# Patient Record
Sex: Male | Born: 1950 | Race: White | Hispanic: No | Marital: Married
Health system: Southern US, Community
[De-identification: ages and names within clinical notes are randomized; demographics above are authoritative.]

---

## 2016-03-31 ENCOUNTER — Encounter (HOSPITAL_COMMUNITY): Payer: Self-pay | Admitting: Cardiology

## 2016-03-31 ENCOUNTER — Inpatient Hospital Stay (HOSPITAL_COMMUNITY): Payer: Medicare HMO

## 2016-03-31 ENCOUNTER — Emergency Department (HOSPITAL_COMMUNITY): Payer: Medicare HMO

## 2016-03-31 ENCOUNTER — Encounter (HOSPITAL_COMMUNITY): Admission: EM | Disposition: E | Payer: Self-pay | Source: Home / Self Care | Attending: Cardiology

## 2016-03-31 DIAGNOSIS — I255 Ischemic cardiomyopathy: Secondary | ICD-10-CM | POA: Diagnosis present

## 2016-03-31 DIAGNOSIS — G253 Myoclonus: Secondary | ICD-10-CM | POA: Diagnosis present

## 2016-03-31 DIAGNOSIS — I249 Acute ischemic heart disease, unspecified: Secondary | ICD-10-CM

## 2016-03-31 DIAGNOSIS — E872 Acidosis: Secondary | ICD-10-CM | POA: Diagnosis present

## 2016-03-31 DIAGNOSIS — I469 Cardiac arrest, cause unspecified: Secondary | ICD-10-CM | POA: Diagnosis present

## 2016-03-31 DIAGNOSIS — N179 Acute kidney failure, unspecified: Secondary | ICD-10-CM | POA: Diagnosis present

## 2016-03-31 DIAGNOSIS — J9601 Acute respiratory failure with hypoxia: Secondary | ICD-10-CM | POA: Diagnosis present

## 2016-03-31 DIAGNOSIS — Z7984 Long term (current) use of oral hypoglycemic drugs: Secondary | ICD-10-CM | POA: Diagnosis not present

## 2016-03-31 DIAGNOSIS — I462 Cardiac arrest due to underlying cardiac condition: Secondary | ICD-10-CM | POA: Diagnosis present

## 2016-03-31 DIAGNOSIS — I472 Ventricular tachycardia: Secondary | ICD-10-CM | POA: Diagnosis present

## 2016-03-31 DIAGNOSIS — E871 Hypo-osmolality and hyponatremia: Secondary | ICD-10-CM | POA: Diagnosis present

## 2016-03-31 DIAGNOSIS — I2510 Atherosclerotic heart disease of native coronary artery without angina pectoris: Secondary | ICD-10-CM | POA: Diagnosis not present

## 2016-03-31 DIAGNOSIS — Z87891 Personal history of nicotine dependence: Secondary | ICD-10-CM

## 2016-03-31 DIAGNOSIS — Z66 Do not resuscitate: Secondary | ICD-10-CM | POA: Diagnosis not present

## 2016-03-31 DIAGNOSIS — J00 Acute nasopharyngitis [common cold]: Secondary | ICD-10-CM | POA: Diagnosis present

## 2016-03-31 DIAGNOSIS — Z515 Encounter for palliative care: Secondary | ICD-10-CM | POA: Diagnosis not present

## 2016-03-31 DIAGNOSIS — R402212 Coma scale, best verbal response, none, at arrival to emergency department: Secondary | ICD-10-CM | POA: Diagnosis present

## 2016-03-31 DIAGNOSIS — E119 Type 2 diabetes mellitus without complications: Secondary | ICD-10-CM | POA: Diagnosis present

## 2016-03-31 DIAGNOSIS — I4901 Ventricular fibrillation: Secondary | ICD-10-CM | POA: Diagnosis present

## 2016-03-31 DIAGNOSIS — G931 Anoxic brain damage, not elsewhere classified: Secondary | ICD-10-CM | POA: Diagnosis present

## 2016-03-31 DIAGNOSIS — R402312 Coma scale, best motor response, none, at arrival to emergency department: Secondary | ICD-10-CM | POA: Diagnosis present

## 2016-03-31 DIAGNOSIS — I5043 Acute on chronic combined systolic (congestive) and diastolic (congestive) heart failure: Secondary | ICD-10-CM | POA: Diagnosis present

## 2016-03-31 DIAGNOSIS — I214 Non-ST elevation (NSTEMI) myocardial infarction: Secondary | ICD-10-CM | POA: Diagnosis present

## 2016-03-31 DIAGNOSIS — I2584 Coronary atherosclerosis due to calcified coronary lesion: Secondary | ICD-10-CM | POA: Diagnosis present

## 2016-03-31 DIAGNOSIS — Z978 Presence of other specified devices: Secondary | ICD-10-CM

## 2016-03-31 DIAGNOSIS — R402112 Coma scale, eyes open, never, at arrival to emergency department: Secondary | ICD-10-CM | POA: Diagnosis present

## 2016-03-31 HISTORY — PX: PERIPHERAL VASCULAR CATHETERIZATION: SHX172C

## 2016-03-31 HISTORY — PX: CARDIAC CATHETERIZATION: SHX172

## 2016-03-31 LAB — POCT I-STAT, CHEM 8
BUN: 24 mg/dL — ABNORMAL HIGH (ref 6–20)
CALCIUM ION: 1.14 mmol/L — AB (ref 1.15–1.40)
CREATININE: 1.1 mg/dL (ref 0.61–1.24)
Chloride: 102 mmol/L (ref 101–111)
GLUCOSE: 435 mg/dL — AB (ref 65–99)
HEMATOCRIT: 39 % (ref 39.0–52.0)
HEMOGLOBIN: 13.3 g/dL (ref 13.0–17.0)
Potassium: 3.8 mmol/L (ref 3.5–5.1)
Sodium: 138 mmol/L (ref 135–145)
TCO2: 20 mmol/L (ref 0–100)

## 2016-03-31 LAB — GLUCOSE, CAPILLARY
GLUCOSE-CAPILLARY: 391 mg/dL — AB (ref 65–99)
Glucose-Capillary: 377 mg/dL — ABNORMAL HIGH (ref 65–99)
Glucose-Capillary: 424 mg/dL — ABNORMAL HIGH (ref 65–99)
Glucose-Capillary: 289 mg/dL — ABNORMAL HIGH (ref 65–99)

## 2016-03-31 LAB — COMPREHENSIVE METABOLIC PANEL
ALT: 46 U/L (ref 17–63)
ANION GAP: 14 (ref 5–15)
AST: 74 U/L — ABNORMAL HIGH (ref 15–41)
Albumin: 2.7 g/dL — ABNORMAL LOW (ref 3.5–5.0)
Alkaline Phosphatase: 75 U/L (ref 38–126)
BILIRUBIN TOTAL: 0.7 mg/dL (ref 0.3–1.2)
BUN: 22 mg/dL — ABNORMAL HIGH (ref 6–20)
CO2: 18 mmol/L — ABNORMAL LOW (ref 22–32)
Calcium: 7.7 mg/dL — ABNORMAL LOW (ref 8.9–10.3)
Chloride: 101 mmol/L (ref 101–111)
Creatinine, Ser: 1.3 mg/dL — ABNORMAL HIGH (ref 0.61–1.24)
GFR, EST NON AFRICAN AMERICAN: 56 mL/min — AB (ref >60)
Glucose, Bld: 431 mg/dL — ABNORMAL HIGH (ref 65–99)
POTASSIUM: 3.8 mmol/L (ref 3.5–5.1)
Sodium: 133 mmol/L — ABNORMAL LOW (ref 135–145)
TOTAL PROTEIN: 5.4 g/dL — AB (ref 6.5–8.1)

## 2016-03-31 LAB — CBC
HCT: 43.8 % (ref 39.0–52.0)
HCT: 38.1 % — ABNORMAL LOW (ref 39.0–52.0)
HCT: 39 % (ref 39.0–52.0)
Hemoglobin: 14.2 g/dL (ref 13.0–17.0)
Hemoglobin: 12.4 g/dL — ABNORMAL LOW (ref 13.0–17.0)
Hemoglobin: 13 g/dL (ref 13.0–17.0)
MCH: 31 pg (ref 26.0–34.0)
MCH: 30.8 pg (ref 26.0–34.0)
MCH: 30.8 pg (ref 26.0–34.0)
MCHC: 32.4 g/dL (ref 30.0–36.0)
MCHC: 32.5 g/dL (ref 30.0–36.0)
MCHC: 33.3 g/dL (ref 30.0–36.0)
MCV: 95.6 fL (ref 78.0–100.0)
MCV: 92.4 fL (ref 78.0–100.0)
MCV: 94.8 fL (ref 78.0–100.0)
PLATELETS: 310 10*3/uL (ref 150–400)
PLATELETS: 262 10*3/uL (ref 150–400)
PLATELETS: 326 10*3/uL (ref 150–400)
RBC: 4.58 MIL/uL (ref 4.22–5.81)
RBC: 4.02 MIL/uL — ABNORMAL LOW (ref 4.22–5.81)
RBC: 4.22 MIL/uL (ref 4.22–5.81)
RDW: 12.8 % (ref 11.5–15.5)
RDW: 12.5 % (ref 11.5–15.5)
RDW: 12.7 % (ref 11.5–15.5)
WBC: 15.2 10*3/uL — AB (ref 4.0–10.5)
WBC: 24.7 10*3/uL — AB (ref 4.0–10.5)
WBC: 27.3 10*3/uL — AB (ref 4.0–10.5)

## 2016-03-31 LAB — BLOOD GAS, ARTERIAL
Acid-base deficit: 7.9 mmol/L — ABNORMAL HIGH (ref 0.0–2.0)
Bicarbonate: 17.4 mmol/L — ABNORMAL LOW (ref 20.0–28.0)
DRAWN BY: 246101
FIO2: 100
MECHVT: 500 mL
O2 SAT: 98.9 %
PEEP: 10 cmH2O
PH ART: 7.304 — AB (ref 7.350–7.450)
Patient temperature: 97.6
RATE: 18 resp/min
pCO2 arterial: 35.8 mmHg (ref 32.0–48.0)
pO2, Arterial: 203 mmHg — ABNORMAL HIGH (ref 83.0–108.0)

## 2016-03-31 LAB — LIPID PANEL
CHOL/HDL RATIO: 4.4 ratio
CHOLESTEROL: 127 mg/dL (ref 0–200)
HDL: 29 mg/dL — AB (ref >40)
LDL Cholesterol: 85 mg/dL (ref 0–99)
Triglycerides: 64 mg/dL (ref <150)
VLDL: 13 mg/dL (ref 0–40)

## 2016-03-31 LAB — TSH
TSH: 1.627 u[IU]/mL (ref 0.350–4.500)
TSH: 0.669 u[IU]/mL (ref 0.350–4.500)

## 2016-03-31 LAB — MRSA PCR SCREENING: MRSA BY PCR: NEGATIVE

## 2016-03-31 LAB — POCT I-STAT TROPONIN I: TROPONIN I, POC: 0.44 ng/mL — AB (ref 0.00–0.08)

## 2016-03-31 LAB — LACTIC ACID, PLASMA: LACTIC ACID, VENOUS: 5.4 mmol/L — AB (ref 0.5–1.9)

## 2016-03-31 LAB — PROTIME-INR
INR: 1.16
PROTHROMBIN TIME: 14.8 s (ref 11.4–15.2)

## 2016-03-31 LAB — CREATININE, SERUM
CREATININE: 1.31 mg/dL — AB (ref 0.61–1.24)
GFR calc non Af Amer: 55 mL/min — ABNORMAL LOW (ref >60)

## 2016-03-31 LAB — APTT: aPTT: 28 seconds (ref 24–36)

## 2016-03-31 LAB — PHOSPHORUS: PHOSPHORUS: 4 mg/dL (ref 2.5–4.6)

## 2016-03-31 LAB — MAGNESIUM: MAGNESIUM: 1.9 mg/dL (ref 1.7–2.4)

## 2016-03-31 LAB — PROCALCITONIN: Procalcitonin: 0.5 ng/mL

## 2016-03-31 LAB — TROPONIN I
TROPONIN I: 0.42 ng/mL — AB (ref <0.03)
TROPONIN I: 0.74 ng/mL — AB (ref <0.03)
Troponin I: 1.09 ng/mL (ref <0.03)

## 2016-03-31 LAB — TRIGLYCERIDES: Triglycerides: 71 mg/dL (ref <150)

## 2016-03-31 SURGERY — LEFT HEART CATH AND CORONARY ANGIOGRAPHY

## 2016-03-31 MED ORDER — SODIUM CHLORIDE 0.9% FLUSH: 3.0000 mL | INTRAVENOUS | Status: DC | PRN

## 2016-03-31 MED ORDER — METOPROLOL TARTRATE 5 MG/5ML IV SOLN
INTRAVENOUS | Status: AC
  Filled 2016-03-31: qty 5

## 2016-03-31 MED ORDER — FENTANYL CITRATE (PF) 100 MCG/2ML IJ SOLN
50.0000 ug | INTRAMUSCULAR | Status: AC | PRN
  Administered 2016-03-31 (×3): 50 ug via INTRAVENOUS

## 2016-03-31 MED ORDER — ADENOSINE 6 MG/2ML IV SOLN
INTRAVENOUS | Status: AC
  Filled 2016-03-31: qty 2

## 2016-03-31 MED ORDER — ACETAMINOPHEN 325 MG PO TABS: 650.0000 mg | ORAL_TABLET | ORAL | Status: DC | PRN

## 2016-03-31 MED ORDER — ADENOSINE 6 MG/2ML IV SOLN
INTRAVENOUS | Status: DC | PRN
  Administered 2016-03-31: 12 mg via INTRAVENOUS
  Administered 2016-03-31: 6 mg via INTRAVENOUS

## 2016-03-31 MED ORDER — CALCIUM GLUCONATE 10 % IV SOLN
1.0000 g | Freq: Once | INTRAVENOUS | Status: AC
  Administered 2016-03-31: 1 g via INTRAVENOUS
  Filled 2016-03-31: qty 10

## 2016-03-31 MED ORDER — FENTANYL CITRATE (PF) 100 MCG/2ML IJ SOLN
50.0000 ug | INTRAMUSCULAR | Status: DC | PRN
  Administered 2016-03-31: 50 ug via INTRAVENOUS
  Filled 2016-03-31: qty 2

## 2016-03-31 MED ORDER — SODIUM CHLORIDE 0.9 % IV SOLN: 250.0000 mL | INTRAVENOUS | Status: DC | PRN

## 2016-03-31 MED ORDER — ADENOSINE 6 MG/2ML IV SOLN
INTRAVENOUS | Status: AC
  Filled 2016-03-31: qty 4

## 2016-03-31 MED ORDER — CARVEDILOL 3.125 MG PO TABS
3.1250 mg | ORAL_TABLET | Freq: Two times a day (BID) | ORAL | Status: DC
  Administered 2016-03-31 (×2): 3.125 mg via ORAL
  Filled 2016-03-31 (×3): qty 1

## 2016-03-31 MED ORDER — ROCURONIUM BROMIDE 50 MG/5ML IV SOLN
INTRAVENOUS | Status: DC | PRN
  Administered 2016-03-31: 100 mg via INTRAVENOUS

## 2016-03-31 MED ORDER — LIDOCAINE HCL (PF) 1 % IJ SOLN
INTRAMUSCULAR | Status: AC
  Filled 2016-03-31: qty 30

## 2016-03-31 MED ORDER — MIDAZOLAM HCL 2 MG/2ML IJ SOLN
INTRAMUSCULAR | Status: AC
  Filled 2016-03-31: qty 2

## 2016-03-31 MED ORDER — INSULIN ASPART 100 UNIT/ML ~~LOC~~ SOLN
0.0000 [IU] | SUBCUTANEOUS | Status: DC
  Administered 2016-03-31: 8 [IU] via SUBCUTANEOUS
  Administered 2016-03-31: 15 [IU] via SUBCUTANEOUS
  Administered 2016-04-01: 3 [IU] via SUBCUTANEOUS
  Administered 2016-04-01: 5 [IU] via SUBCUTANEOUS

## 2016-03-31 MED ORDER — INSULIN REGULAR HUMAN 100 UNIT/ML IJ SOLN
8.0000 [IU] | Freq: Once | INTRAMUSCULAR | Status: DC
  Filled 2016-03-31: qty 0.08

## 2016-03-31 MED ORDER — PANTOPRAZOLE SODIUM 40 MG IV SOLR
40.0000 mg | Freq: Every day | INTRAVENOUS | Status: DC
  Administered 2016-03-31 – 2016-04-01 (×2): 40 mg via INTRAVENOUS
  Filled 2016-03-31 (×2): qty 40

## 2016-03-31 MED ORDER — IOPAMIDOL (ISOVUE-370) INJECTION 76%
INTRAVENOUS | Status: AC
  Filled 2016-03-31: qty 125

## 2016-03-31 MED ORDER — LORAZEPAM 2 MG/ML IJ SOLN
4.0000 mg | Freq: Once | INTRAMUSCULAR | Status: AC
  Administered 2016-03-31: 4 mg via INTRAVENOUS
  Filled 2016-03-31: qty 2

## 2016-03-31 MED ORDER — SODIUM CHLORIDE 0.9 % IV SOLN: INTRAVENOUS | Status: AC

## 2016-03-31 MED ORDER — ASPIRIN 81 MG PO CHEW: 324.0000 mg | CHEWABLE_TABLET | ORAL | Status: AC

## 2016-03-31 MED ORDER — MIDAZOLAM HCL 2 MG/2ML IJ SOLN
1.0000 mg | INTRAMUSCULAR | Status: DC | PRN
  Administered 2016-03-31: 1 mg via INTRAVENOUS
  Filled 2016-03-31: qty 2

## 2016-03-31 MED ORDER — SODIUM CHLORIDE 0.9 % IV SOLN
INTRAVENOUS | Status: DC | PRN
  Administered 2016-03-31: 1000 mL via INTRAVENOUS

## 2016-03-31 MED ORDER — ASPIRIN EC 81 MG PO TBEC
81.0000 mg | DELAYED_RELEASE_TABLET | Freq: Every day | ORAL | Status: DC
  Administered 2016-04-01: 81 mg via ORAL
  Filled 2016-03-31: qty 1

## 2016-03-31 MED ORDER — INSULIN ASPART 100 UNIT/ML ~~LOC~~ SOLN: 0.0000 [IU] | SUBCUTANEOUS | Status: DC

## 2016-03-31 MED ORDER — POTASSIUM CHLORIDE 20 MEQ/15ML (10%) PO SOLN
10.0000 meq | Freq: Two times a day (BID) | ORAL | Status: DC
  Administered 2016-03-31 – 2016-04-01 (×2): 10 meq via ORAL
  Filled 2016-03-31 (×2): qty 15

## 2016-03-31 MED ORDER — FUROSEMIDE 10 MG/ML IJ SOLN
20.0000 mg | Freq: Two times a day (BID) | INTRAMUSCULAR | Status: DC
Start: 2016-03-31 — End: 2016-04-01
  Administered 2016-03-31 – 2016-04-01 (×2): 20 mg via INTRAVENOUS
  Filled 2016-03-31 (×2): qty 2

## 2016-03-31 MED ORDER — FENTANYL CITRATE (PF) 100 MCG/2ML IJ SOLN
INTRAMUSCULAR | Status: DC | PRN
  Administered 2016-03-31: 100 ug via INTRAVENOUS

## 2016-03-31 MED ORDER — LIDOCAINE HCL (PF) 1 % IJ SOLN
INTRAMUSCULAR | Status: DC | PRN
  Administered 2016-03-31: 30 mL

## 2016-03-31 MED ORDER — HEPARIN SODIUM (PORCINE) 5000 UNIT/ML IJ SOLN
5000.0000 [IU] | Freq: Three times a day (TID) | INTRAMUSCULAR | Status: DC
  Administered 2016-03-31 – 2016-04-01 (×3): 5000 [IU] via SUBCUTANEOUS
  Filled 2016-03-31 (×3): qty 1

## 2016-03-31 MED ORDER — ORAL CARE MOUTH RINSE
15.0000 mL | Freq: Four times a day (QID) | OROMUCOSAL | Status: DC
  Administered 2016-04-01 – 2016-04-02 (×5): 15 mL via OROMUCOSAL

## 2016-03-31 MED ORDER — SODIUM CHLORIDE 0.9% FLUSH
3.0000 mL | Freq: Two times a day (BID) | INTRAVENOUS | Status: DC
  Administered 2016-03-31 – 2016-04-01 (×2): 3 mL via INTRAVENOUS

## 2016-03-31 MED ORDER — CHLORHEXIDINE GLUCONATE 0.12% ORAL RINSE (MEDLINE KIT)
15.0000 mL | Freq: Two times a day (BID) | OROMUCOSAL | Status: DC
  Administered 2016-03-31 – 2016-04-01 (×2): 15 mL via OROMUCOSAL

## 2016-03-31 MED ORDER — PROPOFOL 1000 MG/100ML IV EMUL
0.0000 ug/kg/min | INTRAVENOUS | Status: DC
  Administered 2016-03-31: 5 ug/kg/min via INTRAVENOUS
  Administered 2016-04-01: 20 ug/kg/min via INTRAVENOUS
  Filled 2016-03-31 (×2): qty 100

## 2016-03-31 MED ORDER — INSULIN ASPART 100 UNIT/ML ~~LOC~~ SOLN
8.0000 [IU] | Freq: Once | SUBCUTANEOUS | Status: AC
  Administered 2016-03-31: 8 [IU] via SUBCUTANEOUS

## 2016-03-31 MED ORDER — FENTANYL CITRATE (PF) 100 MCG/2ML IJ SOLN
INTRAMUSCULAR | Status: AC
  Filled 2016-03-31: qty 2

## 2016-03-31 MED ORDER — ONDANSETRON HCL 4 MG/2ML IJ SOLN
4.0000 mg | Freq: Four times a day (QID) | INTRAMUSCULAR | Status: DC | PRN
Start: 2016-03-31 — End: 2016-04-02

## 2016-03-31 MED ORDER — METOPROLOL TARTRATE 5 MG/5ML IV SOLN
5.0000 mg | Freq: Once | INTRAVENOUS | Status: AC
  Administered 2016-03-31: 5 mg via INTRAVENOUS

## 2016-03-31 MED ORDER — ASPIRIN 300 MG RE SUPP
300.0000 mg | RECTAL | Status: AC
  Administered 2016-03-31: 300 mg via RECTAL
  Filled 2016-03-31: qty 1

## 2016-03-31 MED ORDER — HEPARIN (PORCINE) IN NACL 2-0.9 UNIT/ML-% IJ SOLN
INTRAMUSCULAR | Status: DC | PRN
  Administered 2016-03-31: 1000 mL

## 2016-03-31 MED ORDER — IOPAMIDOL (ISOVUE-370) INJECTION 76%
INTRAVENOUS | Status: DC | PRN
  Administered 2016-03-31: 50 mL via INTRA_ARTERIAL

## 2016-03-31 MED ORDER — ETOMIDATE 2 MG/ML IV SOLN
INTRAVENOUS | Status: DC | PRN
  Administered 2016-03-31: 20 mg via INTRAVENOUS

## 2016-03-31 MED ORDER — ATORVASTATIN CALCIUM 80 MG PO TABS
80.0000 mg | ORAL_TABLET | Freq: Every day | ORAL | Status: DC
  Administered 2016-03-31: 80 mg via ORAL
  Filled 2016-03-31: qty 1

## 2016-03-31 MED ORDER — MIDAZOLAM HCL 2 MG/2ML IJ SOLN
1.0000 mg | INTRAMUSCULAR | Status: AC | PRN
  Administered 2016-03-31 (×3): 1 mg via INTRAVENOUS

## 2016-03-31 MED ORDER — POTASSIUM CHLORIDE ER 10 MEQ PO TBCR
10.0000 meq | EXTENDED_RELEASE_TABLET | Freq: Two times a day (BID) | ORAL | Status: DC
  Filled 2016-03-31: qty 1

## 2016-03-31 MED ORDER — NITROGLYCERIN 0.4 MG SL SUBL: 0.4000 mg | SUBLINGUAL_TABLET | SUBLINGUAL | Status: DC | PRN

## 2016-03-31 MED ORDER — HEPARIN (PORCINE) IN NACL 2-0.9 UNIT/ML-% IJ SOLN
INTRAMUSCULAR | Status: AC
  Filled 2016-03-31: qty 1000

## 2016-03-31 SURGICAL SUPPLY — 9 items
CATH INFINITI 5FR MPB2 (CATHETERS) ×3 IMPLANT
KIT ENCORE 26 ADVANTAGE (KITS) ×3 IMPLANT
KIT HEART LEFT (KITS) ×3 IMPLANT
PACK CARDIAC CATHETERIZATION (CUSTOM PROCEDURE TRAY) ×3 IMPLANT
SET INTRODUCER MICROPUNCT 5F (INTRODUCER) ×3 IMPLANT
SHEATH PINNACLE 6F 10CM (SHEATH) ×3 IMPLANT
TRANSDUCER W/STOPCOCK (MISCELLANEOUS) ×3 IMPLANT
TUBING CIL FLEX 10 FLL-RA (TUBING) ×3 IMPLANT
WIRE EMERALD 3MM-J .035X150CM (WIRE) ×3 IMPLANT

## 2016-03-31 NOTE — Procedures (Signed)
Intubation Procedure Note Lucas Allen [id number] 08/30/1950  Procedure: Intubation Indications: Airway protection and maintenance  Procedure Details Consent: Risks of procedure as well as the alternatives and risks of each were explained to the (patient/caregiver).  Consent for procedure obtained. and Unable to obtain consent because of emergent medical necessity. Time Out: Verified patient identification, verified procedure, site/side was marked, verified correct patient position, special equipment/implants available, medications/allergies/relevent history reviewed, required imaging and test results available.  Performed  Maximum sterile technique was used including gown and hand hygiene.  [place] and 4    Evaluation Hemodynamic Status: BP stable throughout; O2 sats: currently acceptable Patient's Current Condition: stable Complications: No apparent complications Patient did tolerate procedure well. Chest X-ray ordered to verify placement.  CXR: tube position acceptable.   Lucas Allen, Lucas Allen 04/07/2016

## 2016-03-31 NOTE — Progress Notes (Signed)
Responded to CPR in progress  page to support patient and family. Paient reported to ED via EMS after wife called 911.  Patient had been complaining of not feeling well and  SOB. Patient is current unconscienous, intubated and in cath lab.  Patient is waiting on room assignment. Family has been brief by both EDP and cath Lab staff/cardiologist. I provided ministry of presence, empathetic listening, emotional and spiritual  support to family. Also facilitated information sharing between family and staff. Escorted family to cath lab holding to visit with patient. Chaplain available as needed.   11-06-16 1217  Clinical Encounter Type  Visited With Family;Patient and family together;Health care provider  Visit Type Initial;Spiritual support;ED  Referral From Nurse  Spiritual Encounters  Spiritual Needs Prayer;Emotional  Stress Factors  Family Stress Factors Exhausted;Health changes  Venida JarvisWatlington, Tya Haughey, VirginiaChaplain,pager [phone]

## 2016-03-31 NOTE — Progress Notes (Signed)
EEG completed, results pending. 

## 2016-03-31 NOTE — Progress Notes (Signed)
Transported from ED to Cath Lab without complications.

## 2016-03-31 NOTE — Procedures (Signed)
History: [age] rolled male status post cardiac arrest  Sedation: Occasional fentanyl and Versed  Technique: This is a 21 channel routine scalp EEG performed at the bedside with bipolar and monopolar montages arranged in accordance to the international 10/20 system of electrode placement. One channel was dedicated to EKG recording.    Background: The background is completely flat with the exception of epileptiform-appearing bursts lasting about a second and occurring every 10-20 seconds. These are associated with clinical generalized myoclonus on video.  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 In this setting, this electrographic-clinical pattern is associated with a grim prognosis.  Ritta SlotMcNeill Greenleigh Kauth, MD [facility] [phone]  If 7pm- 7am, please page neurology on call as listed in AMION.

## 2016-03-31 NOTE — H&P (Signed)
Lucas Allen is an 66 y.o. male.   Chief Complaint: Cardiac arrest HPI: Patient with diabetes mellitus, who had been having cough and sputum production over the past 1 week, has been taking Mucinex D frequently at home and also over-the-counter allergy medicine. This morning he stated he didn't feel well. Then he passed out in light of his wife who called the EMS. Fire department very close to arrival on the scene in less than 5 minutes, AED was placed and he had a shockable rhythm and 5 shocks were delivered with return to spontaneous breathing and circulation.  Patient remained unresponsive to verbal commands, due to shallow breathing, he was intubated at home. In the interim EMS arrived. He had normal blood pressure and he was rushed to the emergency room. He also received 1 amp of epi reasons not known by the EMS. On arrival to the ED, patient unresponsive and tachycardic.    Surgical history right forearm amputation  Social History:  has no tobacco, alcohol, and drug history on file. quit cigarettes about 20 years ago, not a heavy smoker.   Allergies: No Known Allergies  Medications Prior to Admission  Medication Sig Dispense Refill  . metFORMIN (GLUCOPHAGE) 500 MG tablet Take 500 mg by mouth 2 (two) times daily with a meal.    . pseudoephedrine-guaifenesin (MUCINEX D) 60-600 MG 12 hr tablet Take 1 tablet by mouth every 12 (twelve) hours.      Results for orders placed or performed during the hospital encounter of 04/26/2016 (from the past 48 hour(s))  CBC     Status: Abnormal   Collection Time: 03/30/2016 10:33 AM  Result Value Ref Range   WBC 15.2 (H) 4.0 - 10.5 K/uL   RBC 4.58 4.22 - 5.81 MIL/uL   Hemoglobin 14.2 13.0 - 17.0 g/dL   HCT 16.143.8 09.639.0 - 04.552.0 %   MCV 95.6 78.0 - 100.0 fL   MCH 31.0 26.0 - 34.0 pg   MCHC 32.4 30.0 - 36.0 g/dL   RDW 40.912.8 81.111.5 - 91.415.5 %   Platelets 310 150 - 400 K/uL  POCT i-Stat troponin I     Status: Abnormal   Collection Time: 04/11/2016 10:46 AM   Result Value Ref Range   Troponin i, poc 0.44 (HH) 0.00 - 0.08 ng/mL   Comment NOTIFIED PHYSICIAN    Comment 3            Comment: Due to the release kinetics of cTnI, a negative result within the first hours of the onset of symptoms does not rule out myocardial infarction with certainty. If myocardial infarction is still suspected, repeat the test at appropriate intervals.   CBC     Status: Abnormal   Collection Time: 04/09/2016 11:34 AM  Result Value Ref Range   WBC 24.7 (H) 4.0 - 10.5 K/uL   RBC 4.02 (L) 4.22 - 5.81 MIL/uL   Hemoglobin 12.4 (L) 13.0 - 17.0 g/dL   HCT 78.238.1 (L) 95.639.0 - 21.352.0 %   MCV 94.8 78.0 - 100.0 fL   MCH 30.8 26.0 - 34.0 pg   MCHC 32.5 30.0 - 36.0 g/dL   RDW 08.612.7 57.811.5 - 46.915.5 %   Platelets 262 150 - 400 K/uL  Glucose, capillary     Status: Abnormal   Collection Time: 04/04/2016 11:52 AM  Result Value Ref Range   Glucose-Capillary 377 (H) 65 - 99 mg/dL   Dg Chest Portable 1 View  Result Date: 04/27/2016 CLINICAL DATA:  Chest pain, NG tube placement  intubated EXAM: PORTABLE CHEST 1 VIEW COMPARISON:  None. FINDINGS: Cardiomediastinal silhouette is unremarkable. Central vascular congestion and perihilar interstitial prominence with hazy airspace disease highly suspicious for pulmonary edema. Less likely bilateral pneumonia. There is endotracheal tube in place with tip 4.3 cm above the carina. NG tube in place with tip in proximal stomach. No pneumothorax. IMPRESSION: Central vascular congestion and perihilar interstitial prominence with hazy airspace disease highly suspicious for pulmonary edema. Less likely bilateral pneumonia. There is endotracheal tube in place with tip 4.3 cm above the carina. NG tube in place with tip in proximal stomach. No pneumothorax. Electronically Signed   By: Natasha Mead M.D.   On: 04/14/2016 11:21    Review of Systems  Constitutional: Negative for chills and fever.  Respiratory: Positive for cough, sputum production and shortness of breath.    Cardiovascular: Negative for chest pain and palpitations.  Gastrointestinal: Positive for heartburn.  Genitourinary: Negative for dysuria.  Neurological: Negative for dizziness and headaches.    Blood pressure 120/86, pulse (!) 0, temperature 97.6 F (36.4 C), temperature source Oral, resp. rate (!) 78, SpO2 100 %. Physical Exam  Constitutional: No distress.  Patient is intubated. Patient was resisting while being supported with Ambu bag.  Eyes: Conjunctivae are normal.  Neck: Neck supple.  Cardiovascular: Normal rate.   Tachycardia present  Respiratory: Breath sounds normal.  GI: Soft. Bowel sounds are normal.  Musculoskeletal: He exhibits no edema.  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 Normal left upper extremity and bilateral lower extremities.  Skin: Skin is warm.  Vascular exam: All pulses were equal in both lower extremities and also left radial artery and carotid artery revealed no bruit.   Assessment  1. Witnessed cardiac arrest, ADD recommended shocking 5, returned to spontaneous breathing and circulation failed quickly. EKG in the field revealed ST elevation in the anterolateral lead suggestive of ST elevation MI along with probable atrial flutter with 2-1 conduction. Ventricular rate 140 bpm. Repeat EKG at less than 5-10 minutes revealing ST segment changes back to baseline. Nonspecific T abnormality. 2. Diabetes mellitus type 2 controlled 3. Upper respiratory infection per family over the past 1 week.  Recommendation: Patient will be taken emergently to cardiac catheterization lab and further recommendations will follow.  Yates Decamp, MD 04/22/2016, 11:57 AM

## 2016-03-31 NOTE — ED Provider Notes (Signed)
[facility] DEPT Provider Note   CSN: [id number] Arrival date & time: 03/30/2016  1023     History   Chief Complaint Chief Complaint  Patient presents with  . Cardiac Arrest    HPI Lucas Allen is a 66 y.o. male.  The history is provided by the EMS personnel. The history is limited by the condition of the patient. No language interpreter was used.    This is a 66 year old male with unknown past medical history who presents today with loss of consciousness and findings concerning for STEMI. Per report approximately an hour ago, patient passed out and further wife and slid out of his chair. On arrival by fire department, patient was placed on a ED and was defibrillated within 5 minutes of the call. King airway was placed and patient was brought here for further evaluation. EMS reports that the wife stated that the patient had been complaining of cough and congestion in the last few days and had taken Mucinex. Otherwise the history is limited due the patient's mental status. Wife is reportedly on the way. No related history of prior coronary issues.  EMS EKG: ST elevateions in I, aVL, lateral leads.    No past medical history on file.  There are no active problems to display for this patient.   No past surgical history on file.     Home Medications    Prior to Admission medications   Not on File    Family History No family history on file.  Social History Social History  Substance Use Topics  . Smoking status: Not on file  . Smokeless tobacco: Not on file  . Alcohol use Not on file     Allergies   Patient has no known allergies.   Review of Systems Review of Systems  Unable to perform ROS: Acuity of condition     Physical Exam Updated Vital Signs BP 105/73   Pulse (!) 146   Temp 97.6 F (36.4 C) (Oral)   Resp 16   SpO2 98%   Physical Exam  Constitutional: He is intubated.  HENT:  Head: Normocephalic and atraumatic.  Eyes: Conjunctivae are  normal. Pupils are equal, round, and reactive to light.  Neck: Normal range of motion. Neck supple.  Cardiovascular: Regular rhythm.  Tachycardia present.   Pulmonary/Chest: He is intubated. He has no wheezes. He has no rales.  Spontaneously breathing efforts, King airway inplace, BVM attached.  Abdominal: Soft. He exhibits no distension.  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 GCS eye subscore is 1. GCS verbal subscore is 1. GCS motor subscore is 1.  Skin: Skin is warm and dry.     ED Treatments / Results  Labs (all labs ordered are listed, but only abnormal results are displayed) Labs Reviewed  CBC - Abnormal; Notable for the following:       Result Value   WBC 15.2 (*)    All other components within normal limits  BASIC METABOLIC PANEL  TSH  HEMOGLOBIN A1C  LIPID PANEL  I-STAT TROPOININ, ED    EKG  EKG Interpretation None       Radiology No results found.  Procedures Date/Time: 04/27/2016 11:02 AM Performed by: Theodosia Quay Pre-anesthesia Checklist: Patient identified, Emergency Drugs available, Patient being monitored and Suction available Oxygen Delivery Method: Ambu bag Preoxygenation: Pre-oxygenation with 100% oxygen Intubation Type: Rapid sequence Laryngoscope Size: Mac and 4 Grade View: Grade II Tube size:  7.5 mm Number of attempts: 1 Placement Confirmation: ETT inserted through vocal cords under direct vision,  CO2 detector and Breath sounds checked- equal and bilateral Secured at: 23 (at teeth) cm Tube secured with: ETT holder      (including critical care time)  Medications Ordered in ED Medications  fentaNYL (SUBLIMAZE) injection ( Intravenous MAR Hold 04/15/2016 1102)  etomidate (AMIDATE) injection ( Intravenous MAR Hold 04/10/2016 1102)  rocuronium (ZEMURON) injection ( Intravenous MAR Hold 04/10/2016 1102)  fentaNYL (SUBLIMAZE) 100 MCG/2ML injection (not administered)   0.9 %  sodium chloride infusion (1,000 mLs Intravenous New Bag/Given 04/10/2016 1035)  adenosine (ADENOCARD) 6 MG/2ML injection ( Intravenous MAR Hold 03/29/2016 1102)  adenosine (ADENOCARD) 6 MG/2ML injection (not administered)  adenosine (ADENOCARD) 6 MG/2ML injection (not administered)  metoprolol (LOPRESSOR) 5 MG/5ML injection (not administered)  metoprolol (LOPRESSOR) injection 5 mg (5 mg Intravenous Given 04/01/2016 1057)     Initial Impression / Assessment and Plan / ED Course  I have reviewed the triage vital signs and the nursing notes.  Pertinent labs & imaging results that were available during my care of the patient were reviewed by me and considered in my medical decision making (see chart for details).  Clinical Course     66 year old male presents today with concern for STEMI. On arrival patient has [facility] airway in place. After initial stabilization, King airway was exchanged for 7-1/2 ET tube. Placement confirmed with chest x-ray. Shortly after this point, patient did have desaturation event. He was bagged and his O2 saturation came up from the 80s to 100%. Placed back on the ventilator and remained in the 90s at that point. This could've possibly happened from of atelectasis following the intubation.  Cardiology is here in the emergency department and evaluated the patient. Patient does not have ST elevations present on his EKG here. Given his cardiac arrest and need for defibrillation, he will still go to the Cath Lab emergently for further evaluation. Patient had tachycardia noted here in cardiology requested adenosine which was given and a 6 mg dose followed by 12 mg dose with no significant change in his tachycardia.  At this point, blood pressure remains relatively stable. He will go to the Cath Lab for further interventions. He was in serious condition at the time of admission and transfer to the Cath Lab.  Final Clinical Impressions(s) / ED Diagnoses   Final diagnoses:  Cardiac  arrest [facility])  ACS (acute coronary syndrome) ([place])    New Prescriptions There are no discharge medications for this patient.    Theodosia Quay, MD 04/27/2016 [place], DO 04/01/2016 1231

## 2016-03-31 NOTE — Procedures (Signed)
Arterial Catheter Insertion Procedure Note Lucas Allen [id number] 1950/08/24  Procedure: Insertion of Arterial Catheter  Indications: Blood pressure monitoring and Frequent blood sampling  Procedure Details Consent: Risks of procedure as well as the alternatives and risks of each were explained to the (patient/caregiver).  Consent for procedure obtained. Time Out: Verified patient identification, verified procedure, site/side was marked, verified correct patient position, special equipment/implants available, medications/allergies/relevent history reviewed, required imaging and test results available.  Performed  Maximum sterile technique was used including antiseptics, cap, gloves, gown, hand hygiene, mask and sheet. Skin prep: Chlorhexidine; local anesthetic administered 20 gauge catheter was inserted into left radial artery using the Seldinger technique.  Evaluation Blood flow good; BP tracing good. Complications: No apparent complications.   Lucas Allen, Lucas Allen 04/27/2016

## 2016-03-31 NOTE — Progress Notes (Signed)
eLink Physician-Brief Progress Note Patient Name: Lucas Allen DOB: 11/22/1950 MRN: [id number]   Date of Service  04/25/2016  HPI/Events of Note  Pt is having myoclonus. EEG hasn't been read, but twitching appears consistent w this. Will start propofol to see if twitching decreases.   eICU Interventions       Intervention Category Minor Interventions: Agitation / anxiety - evaluation and management  Arlan Birks S. 04/26/2016, 9:01 PM

## 2016-03-31 NOTE — ED Triage Notes (Signed)
[facility]- pt had witnessed arrest with family. Called 911, pt received 1 shock from fire department. Pt has king airway in place. Pulses present after shock. Pt breathing over BVM. Medical hx unknown. Vitals stable with EMS.

## 2016-03-31 NOTE — Progress Notes (Addendum)
Dr Delton CoombesByrum called about CBG 424 and possible myoclonus activity not controlled by fent/versed prn. Orders received.  Lab result Lactic Acid 5.4 called to ELink; [facility] with [facility] RN who will give to Dr Delton CoombesByrum.

## 2016-03-31 NOTE — Consult Note (Signed)
PULMONARY / CRITICAL CARE MEDICINE   Name: Lucas Allen MRN: [id number] DOB: 05/18/1950    ADMISSION DATE:  04/10/2016 CONSULTATION DATE:  04/06/2016  REFERRING MD:  Jacinto Halim  CHIEF COMPLAINT:  Cardiac Arrest  HISTORY OF PRESENT ILLNESS:  Pt is encephelopathic; therefore, this HPI is obtained from chart review. Lucas Allen is a 66 y.o. male with PMH of DM just diagnosed in fall of 2016 and currently on metformin.  Besides a URI / common cold for past 3 weeks (for which he had been taking Mucinex DM for congestion), he was in his usual state of health up until morning of 01/04.  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 Wife saw events unfold, went to his side, and noted that he was agonal.  She called EMS and got him to floor where he remained agonal.  Fire department arrived in less than 5 minutes as they live very close to the station.  AED was placed and advised to administer shock.  He was shocked a total of 5 times before ROSC.  EKG in the field demonstrated antero-lateral STEMI.  He was then brought to [facility] ED where he was intubated and had repeat EKG which was negative for STEMI.  Due to nature of arrest, he was taken for emergent cath by Dr. Jacinto Halim.  Cath revealed severe 3 vessel disease not amenable to intervention along with EF of 15%.  He was then taken to PACU and PCCM was called for vent management.  CVTS will also be consulted for consideration CABG.  Due to severe 3 vessel disease and reduced EF, discussed case with Dr. Jacinto Halim regarding hypothermia and decision made to defer at this time due to high risk of arrhythmia with vasopressors used during hypothermia.  PAST MEDICAL HISTORY :  He  has no past medical history on file.  PAST SURGICAL HISTORY: He  has no past surgical history on file.  No Known Allergies  No  current facility-administered medications on file prior to encounter.    No current outpatient prescriptions on file prior to encounter.    FAMILY HISTORY:  His has no family status information on file.    SOCIAL HISTORY: He    REVIEW OF SYSTEMS:   Unable to obtain as pt is encephalopathic.  SUBJECTIVE:  On vent, completely unresponsive.  VITAL SIGNS: BP (!) 148/99   Pulse (!) 122   Temp 97.6 F (36.4 C) (Oral)   Resp 17   SpO2 99%   HEMODYNAMICS:    VENTILATOR SETTINGS: Vent Mode: PRVC FiO2 (%):  [100 %] 100 % Set Rate:  [16 bmp] 16 bmp Vt Set:  [500 mL] 500 mL PEEP:  [10 cmH20] 10 cmH20 Plateau Pressure:  [21 cmH20-22 cmH20] 22 cmH20  INTAKE / OUTPUT: No intake/output data recorded.   PHYSICAL EXAMINATION: General: Adult middle aged male, critically ill. Neuro: Remains completely unresponsive.  Pupils sluggish, no corneals, no gag. HEENT: [place]/AT. Pupils sluggish, sclerae anicteric.  ETT in place. Cardiovascular: Tachy, regular, no M/R/G.  Lungs: Respirations even and unlabored.  CTA bilaterally, No W/R/R.  On vent. Abdomen: BS x 4, soft, NT/ND.  Musculoskeletal: No gross deformities, no edema.  Skin: Intact, warm, no rashes.  LABS:  BMET  Recent Labs Lab 04/26/2016 1134  NA 133*  K 3.8  CL 101  CO2 18*  BUN 22*  CREATININE 1.30*  GLUCOSE 431*    Electrolytes  Recent Labs Lab 03/28/2016 1134  CALCIUM 7.7*    CBC  Recent Labs Lab 04/01/2016 1033 04/11/2016 1134  WBC 15.2* 24.7*  HGB 14.2 12.4*  HCT 43.8 38.1*  PLT 310 262    Coag's  Recent Labs Lab 04/08/2016 1134  APTT 28  INR 1.16    Sepsis Markers No results for input(s): LATICACIDVEN, PROCALCITON, O2SATVEN in the last 168 hours.  ABG No results for input(s): PHART, PCO2ART, PO2ART in the last 168 hours.  Liver Enzymes  Recent Labs Lab 04/19/2016 1134  AST 74*  ALT 46  ALKPHOS 75  BILITOT 0.7  ALBUMIN 2.7*    Cardiac Enzymes  Recent Labs Lab 04/04/2016 1134   TROPONINI 0.42*    Glucose  Recent Labs Lab 04/12/2016 1152  GLUCAP 377*    Imaging Dg Chest Portable 1 View  Result Date: 04/11/2016 CLINICAL DATA:  Chest pain, NG tube placement intubated EXAM: PORTABLE CHEST 1 VIEW COMPARISON:  None. FINDINGS: Cardiomediastinal silhouette is unremarkable. Central vascular congestion and perihilar interstitial prominence with hazy airspace disease highly suspicious for pulmonary edema. Less likely bilateral pneumonia. There is endotracheal tube in place with tip 4.3 cm above the carina. NG tube in place with tip in proximal stomach. No pneumothorax. IMPRESSION: Central vascular congestion and perihilar interstitial prominence with hazy airspace disease highly suspicious for pulmonary edema. Less likely bilateral pneumonia. There is endotracheal tube in place with tip 4.3 cm above the carina. NG tube in place with tip in proximal stomach. No pneumothorax. Electronically Signed   By: Natasha MeadLiviu  Pop M.D.   On: 04/11/2016 11:21     STUDIES:  CXR 01/04 > central vascular congestion. Echo 01/04 > EEG 01/04 >  CULTURES: None.  ANTIBIOTICS: None.  SIGNIFICANT EVENTS: 01/04 > admitted.  LINES/TUBES: ETT 01/04 >  DISCUSSION: 66 y.o. male with hx DM, admitted 01/04 with cardiac arrest (VT/VF).  Required shock x 5. [facility]ained [facility] and cath revealed severe 3 vessel disease with EF 15%.  Decision made to defer cooling (after talking with Dr. Jacinto HalimGanji) due to risk of arrhthymias in setting severe 3 vessel disease and EF 15%.  ASSESSMENT / PLAN:  CARDIOVASCULAR A:  VF / VT witnessed arrest. Severe ICM - severe 3 vessel disease with EF 15% per cath lab. Troponin leak - due to above. P:  Defer hypothermia due to risk of arrhthymias in setting severe 3 vessel disease and EF 15%. Cardiology following / managing. Needs full ischemic workup. CVTS consult. Trend troponin, lactate.  NEUROLOGIC A:   Acute encephalopathy - concern for  significant anoxic brain injury given physical exam findings. P:   Sedation:  Fentanyl PRN / Midazolam PRN. RASS goal: 0 to -1. Daily WUA. Assess EEG.  PULMONARY A: Acute hypoxic respiratory failure - s/p intubation after cardiac arrest. Acute pulmonary edema. P:   Full vent support. Wean as able. VAP prevention measures. SBT in AM if neuro status improves. Repeat CXR now - consider lasix (clear on exam). CXR in AM.  RENAL A:   AKI. Hypocalcemia - corrects to 8.7. AGMA - presumed lactate. Hyponatremia - presumed hypervolemic. P:   KVO fluids. 1g Ca gluconate. BMP in AM.  GASTROINTESTINAL A:   GI prophylaxis. Nutrition. P:   SUP: Pantoprazole. NPO.  HEMATOLOGIC A:   VTE Prophylaxis. P:  SCD's / heparin. CBC in AM.  INFECTIOUS A:   No indication of infection. P:   Monitor clinically.  ENDOCRINE A:   Hx DM - on metformin.   P:   SSI. Assess hgb A1c, TSH. Hold preadmission metformin.   Family updated: Wife, daughter, son updated.  Interdisciplinary Family Meeting v Palliative Care Meeting:  Due by: 04/08/15.  CC time: 35 minutes.   Rutherford Guys, [place] [facility] [place] [facility] Pager: [phone]  or [phone] Apr 15, 2016, 1:05 PM   ATTENDING NOTE / ATTESTATION NOTE :   I have discussed the case with the resident/APP  Rutherford Guys  I agree with the resident/APP's  history, physical examination, assessment, and plans.    I have edited the above note and modified it according to our agreed history, physical examination, assessment and plan.   Briefly, patient with a recent diagnosis of diabetes, with recent URI symptoms the last week or so, presents with acute altered mental status and seizure and witnessed arrest. EKG revealed STEMI. LHC revealed severe three-vessel disease not amenable to stents. EF 15%. He was kept on the ventilator post catheterization. He did not receive any sedation during the procedure. By the  time I examined him post catheterization, he was starting to open his eyes and move his fingers.  PE as above. As mentioned, starting to open his eyes, move his fingers. Pupils are bilateral and 3 mm an reactive. (-) Lateralizing signs noted. Blood pressure 110/80, heart rate 110, respiratory rate 20, sats 100% on 40% FiO2. ET tube in place. Comfortable. Not in distress. Neck veins not prominent. Good air entry. Clear to auscultation. Soft S1 and S2. No murmur. Abdomen was benign. Warm extremities. No edema.  Labs reviewed. Creatinine 1.3. Troponin 0.42. WBC 25. Chest x-ray with bilateral infiltrates consistent with edema.  Assessment and plan : 1. CAD, severe 3VD, not amenable to stenting. Cards is primary service. Cardiology will consult TCVS for CABG. Per cardiology, holding off on hypothermia protocol as he was concerned about BP dropping with hypothermia protocol and being placed on pressors which may trigger Vtach/Vfib given low EF of 15 %.   2. Acute hypoxemic respiratory failure secondary to acute pulmonary edema and severe 3VD. Clinically he presented with 2 week history of cough. Continue ventilator support for now. Chest x-ray concerning for pulmonary edema. We'll send for tracheal aspirate. Check pro calcitonin.  Holding off on antibiotics unless he clinically worsens or if he starts spiking/having fevers.   3. AKI likely 2/2 CAD/CHF.  Will observe creatinine. Being hydrated gingerly.  4. DM. On SSI.   5. Encephalopathy. S/P Arrest. We'll need to determine neurologic function the next 24 hours. May need MRI. We'll need EEG.  I have spent 30 minutes of critical care time with this patient today.  Family :Family updated at length today. I discussed the case with patient's wife and 2 children.   Pollie Meyer, MD 04-15-16, 3:21 PM [place] [facility] Pager [phone] After 3 pm or if no answer, call [phone]

## 2016-03-31 NOTE — Progress Notes (Signed)
Patient is opening eyes but not tracking, not following commands. Light twitching of head and arms. Sedated as needed.

## 2016-03-31 NOTE — Progress Notes (Signed)
Site area: right groin fa sheath pulled and pressure held by Toll Brothersammy Mink Site Prior to Removal:  Level 0 Pressure Applied For: 20 minutes Manual:   yes Patient Status During Pull:  stable Post Pull Site:  Level  0 Post Pull Instructions Given:  NA-unresponsive Post Pull Pulses Present: yes Dressing Applied:  yes Bedrest begins @  1210 Comments:

## 2016-04-01 ENCOUNTER — Inpatient Hospital Stay (HOSPITAL_COMMUNITY): Payer: Medicare HMO

## 2016-04-01 DIAGNOSIS — I2510 Atherosclerotic heart disease of native coronary artery without angina pectoris: Secondary | ICD-10-CM

## 2016-04-01 LAB — BLOOD GAS, ARTERIAL
ACID-BASE DEFICIT: 0.1 mmol/L (ref 0.0–2.0)
BICARBONATE: 23.5 mmol/L (ref 20.0–28.0)
Drawn by: 418751
FIO2: 60
LHR: 18 {breaths}/min
O2 SAT: 94.2 %
PEEP/CPAP: 5 cmH2O
PH ART: 7.442 (ref 7.350–7.450)
Patient temperature: 98.6
VT: 510 mL
pCO2 arterial: 35 mmHg (ref 32.0–48.0)
pO2, Arterial: 72.3 mmHg — ABNORMAL LOW (ref 83.0–108.0)

## 2016-04-01 LAB — GLUCOSE, CAPILLARY
GLUCOSE-CAPILLARY: 110 mg/dL — AB (ref 65–99)
GLUCOSE-CAPILLARY: 112 mg/dL — AB (ref 65–99)
Glucose-Capillary: 203 mg/dL — ABNORMAL HIGH (ref 65–99)
Glucose-Capillary: 161 mg/dL — ABNORMAL HIGH (ref 65–99)
Glucose-Capillary: 113 mg/dL — ABNORMAL HIGH (ref 65–99)

## 2016-04-01 LAB — CBC
HCT: 37.9 % — ABNORMAL LOW (ref 39.0–52.0)
HEMOGLOBIN: 12.7 g/dL — AB (ref 13.0–17.0)
MCH: 30.4 pg (ref 26.0–34.0)
MCHC: 33.5 g/dL (ref 30.0–36.0)
MCV: 90.7 fL (ref 78.0–100.0)
Platelets: 293 10*3/uL (ref 150–400)
RBC: 4.18 MIL/uL — AB (ref 4.22–5.81)
RDW: 12.6 % (ref 11.5–15.5)
WBC: 20.8 10*3/uL — ABNORMAL HIGH (ref 4.0–10.5)

## 2016-04-01 LAB — ECHOCARDIOGRAM COMPLETE
HEIGHTINCHES: 66 in
Weight: 2821.89 oz

## 2016-04-01 LAB — HEMOGLOBIN A1C
HEMOGLOBIN A1C: 10.4 % — AB (ref 4.8–5.6)
Hgb A1c MFr Bld: 10.2 % — ABNORMAL HIGH (ref 4.8–5.6)
Hgb A1c MFr Bld: 10.1 % — ABNORMAL HIGH (ref 4.8–5.6)
MEAN PLASMA GLUCOSE: 246 mg/dL
MEAN PLASMA GLUCOSE: 243 mg/dL
MEAN PLASMA GLUCOSE: 252 mg/dL

## 2016-04-01 LAB — BASIC METABOLIC PANEL
ANION GAP: 10 (ref 5–15)
BUN: 28 mg/dL — ABNORMAL HIGH (ref 6–20)
CALCIUM: 8.8 mg/dL — AB (ref 8.9–10.3)
CO2: 22 mmol/L (ref 22–32)
Chloride: 107 mmol/L (ref 101–111)
Creatinine, Ser: 1.31 mg/dL — ABNORMAL HIGH (ref 0.61–1.24)
GFR, EST NON AFRICAN AMERICAN: 55 mL/min — AB (ref >60)
GLUCOSE: 150 mg/dL — AB (ref 65–99)
POTASSIUM: 4.3 mmol/L (ref 3.5–5.1)
Sodium: 139 mmol/L (ref 135–145)

## 2016-04-01 LAB — MAGNESIUM: MAGNESIUM: 2 mg/dL (ref 1.7–2.4)

## 2016-04-01 LAB — PHOSPHORUS: PHOSPHORUS: 3.6 mg/dL (ref 2.5–4.6)

## 2016-04-01 LAB — TROPONIN I: Troponin I: 1.15 ng/mL (ref <0.03)

## 2016-04-01 LAB — PROCALCITONIN: Procalcitonin: 2.04 ng/mL

## 2016-04-01 LAB — LACTIC ACID, PLASMA: LACTIC ACID, VENOUS: 1.2 mmol/L (ref 0.5–1.9)

## 2016-04-01 MED ORDER — MIDAZOLAM BOLUS VIA INFUSION (WITHDRAWAL LIFE SUSTAINING TX)
5.0000 mg | INTRAVENOUS | Status: DC | PRN
  Filled 2016-04-01: qty 20

## 2016-04-01 MED ORDER — FENTANYL BOLUS VIA INFUSION
50.0000 ug | INTRAVENOUS | Status: DC | PRN
  Filled 2016-04-01: qty 200

## 2016-04-01 MED ORDER — SODIUM CHLORIDE 0.9 % IV SOLN
1.0000 mg/h | INTRAVENOUS | Status: DC
  Administered 2016-04-01 – 2016-04-02 (×2): 2 mg/h via INTRAVENOUS
  Filled 2016-04-01 (×2): qty 10

## 2016-04-01 MED ORDER — FENTANYL 2500MCG IN NS 250ML (10MCG/ML) PREMIX INFUSION
100.0000 ug/h | INTRAVENOUS | Status: DC
  Administered 2016-04-01: 100 ug/h via INTRAVENOUS
  Administered 2016-04-02: 150 ug/h via INTRAVENOUS
  Filled 2016-04-01 (×2): qty 250

## 2016-04-01 NOTE — Progress Notes (Signed)
Responded to spiritual consult to continue support to family at bedside.  Patient doctor was with family upon my arrival updating wife and daughter on patient status. Patient condition per nurse is stable but lingering and family at some point will probably have to decide how they want to continue. Family is tearful ,faithful coping and accepting. I provided prayer, ministry of presence,  listening,emotional and spiritual support. Chaplain is available as needed.   04/01/16 1000  Clinical Encounter Type  Visited With Patient and family together;Health care provider  Visit Type Follow-up;Spiritual support  Referral From Chaplain  Spiritual Encounters  Spiritual Needs Prayer;Emotional;Grief support  Stress Factors  Family Stress Factors Exhausted;Family relationships;Major life changes  Venida JarvisWatlington, [place], Chaplain,pager [phone]

## 2016-04-01 NOTE — Progress Notes (Addendum)
Talked to daughter. I explained to them that prognosis is mainly determined from our physical exam especially in the setting of myoclonus and reiterated findings of previous EEG. She understands that the repeat EEG would not likely change the prognosis but feels that her mother's mind would be more at ease with a repeat EEG. Order placed for EEG.

## 2016-04-01 NOTE — Progress Notes (Signed)
PULMONARY / CRITICAL CARE MEDICINE   Name: Lucas Allen MRN: [id number] DOB: 06-15-1950    ADMISSION DATE:  04/15/2016 CONSULTATION DATE:  04/25/2016  REFERRING MD:  Jacinto Halim  CHIEF COMPLAINT:  Cardiac Arrest  HISTORY OF PRESENT ILLNESS:  Pt is encephelopathic; therefore, this HPI is obtained from chart review. 66 year old man with DM2 (dx fall of 2016) presenting after he suddenly slumped over, turned bright red, then starting having possible seizure like activity. Witnessed by wife. AED placed upon arrival of EMS and received shocks 5 times before ROSC. Found to have anterolateral STEMI on EKG. He was intubated in the ED and repeat EKG negative for STEMI. Underwent cath with severe 3 vessel disease not amenable to intervention along with EF of 15%.  CVTS consulted for consideration CABG.  Hypothermia protocol deferred at this time due to high risk of arrhythmia with vasopressors used during hypothermia.  SUBJECTIVE:  Noted to have myoclonus overnight. Unresponsive on vent  VITAL SIGNS: BP (!) 108/55   Pulse (!) 121   Temp 98.7 F (37.1 C) (Axillary)   Resp (!) 25   Ht 5\' 6"  (1.676 m)   Wt 176 lb 5.9 oz (80 kg)   SpO2 96%   BMI 28.47 kg/m   VENTILATOR SETTINGS: Vent Mode: PRVC FiO2 (%):  [60 %-100 %] 60 % Set Rate:  [16 bmp-18 bmp] 18 bmp Vt Set:  [500 mL-510 mL] 510 mL PEEP:  [5 cmH20-10 cmH20] 5 cmH20 Plateau Pressure:  [15 cmH20-23 cmH20] 15 cmH20  INTAKE / OUTPUT: I/O last 3 completed shifts: In: 385.6 [I.V.:335.6; NG/GT:50] Out: 1125 [Urine:1125]   PHYSICAL EXAMINATION: General: NAD Neuro: Unresponsive HEENT: [place]/AT. Pupils sluggish, sclerae anicteric.  ETT in place. Cardiovascular: Tachycardic  Lungs: Respirations even and unlabored.  CTA bilaterally, No W/R/R.  On vent. Abdomen: soft, NT/ND.  Musculoskeletal: No gross deformities, no edema.  Skin: Intact, warm, no rashes.  LABS:  BMET  Recent Labs Lab 03/28/2016 1122 04/18/2016 1134 04/01/2016 1224  04/01/16 0512  NA 138 133*  --  139  K 3.8 3.8  --  4.3  CL 102 101  --  107  CO2  --  18*  --  22  BUN 24* 22*  --  28*  CREATININE 1.10 1.30* 1.31* 1.31*  GLUCOSE 435* 431*  --  150*    Electrolytes  Recent Labs Lab 04/04/2016 1134 04/09/2016 1920 04/01/16 0512  CALCIUM 7.7*  --  8.8*  MG  --  1.9 2.0  PHOS  --  4.0 3.6    CBC  Recent Labs Lab 04/14/2016 1134 04/01/2016 1224 04/01/16 0512  WBC 24.7* 27.3* 20.8*  HGB 12.4* 13.0 12.7*  HCT 38.1* 39.0 37.9*  PLT 262 326 293    Coag's  Recent Labs Lab 03/29/2016 1134  APTT 28  INR 1.16    Sepsis Markers  Recent Labs Lab 04/15/2016 1600 03/29/2016 1605 04/01/16 0756  LATICACIDVEN  --  5.4* 1.2  PROCALCITON 0.50  --   --     ABG  Recent Labs Lab 04/23/2016 1603 04/01/16 0336  PHART 7.304* 7.442  PCO2ART 35.8 35.0  PO2ART 203* 72.3*    Liver Enzymes  Recent Labs Lab 04/15/2016 1134  AST 74*  ALT 46  ALKPHOS 75  BILITOT 0.7  ALBUMIN 2.7*    Cardiac Enzymes  Recent Labs Lab 04/22/2016 1224 03/30/2016 1920 04/01/16 0512  TROPONINI 0.74* 1.09* 1.15*    Glucose  Recent Labs Lab 04/09/2016 1658 04/15/2016 2122 04/01/16 0038 04/01/16 0432 04/01/16  0803 04/01/16 0815  GLUCAP 424* 289* 203* 161* 112* 110*    Imaging Portable Chest Xray  Result Date: 04/01/2016 CLINICAL DATA:  Cardiac arrest. EXAM: PORTABLE CHEST 1 VIEW COMPARISON:  04/01/2015. FINDINGS: Endotracheal tube and NG tube in stable position. Heart size stable diffuse bilateral pulmonary infiltrates/edema again noted without interim change. Bilateral pleural effusions again noted. No pneumothorax. No pneumothorax. IMPRESSION: 1. Lines and tubes stable position. 2. Diffuse bilateral pulmonary infiltrates/edema without significant interim change. Bilateral pleural effusions are also again noted. Electronically Signed   By: Maisie Fushomas  Register   On: 04/01/2016 06:53   Portable Chest Xray  Result Date: 04/26/2016 CLINICAL DATA:  Post cardiac  arrest. EXAM: PORTABLE CHEST 1 VIEW COMPARISON:  March 31, 2016 FINDINGS: The ETT is in good position. The NG tube terminates below today's film. No pneumothorax. There may be layering effusions bilaterally. Bilateral pulmonary opacities are relatively diffuse with sparing in the right upper lobe, possibly asymmetric edema. Bilateral pneumonia considered less likely. IMPRESSION: Support apparatus in good position. Continued bilateral pulmonary opacities are favored to represent edema with focal sparing in the right upper lobe. An infectious process is considered less likely. Probable bilateral layering effusions. Electronically Signed   By: Gerome Samavid  Williams III M.D   On: 05/08/2016 13:55   Dg Chest Portable 1 View  Result Date: 04/25/2016 CLINICAL DATA:  Chest pain, NG tube placement intubated EXAM: PORTABLE CHEST 1 VIEW COMPARISON:  None. FINDINGS: Cardiomediastinal silhouette is unremarkable. Central vascular congestion and perihilar interstitial prominence with hazy airspace disease highly suspicious for pulmonary edema. Less likely bilateral pneumonia. There is endotracheal tube in place with tip 4.3 cm above the carina. NG tube in place with tip in proximal stomach. No pneumothorax. IMPRESSION: Central vascular congestion and perihilar interstitial prominence with hazy airspace disease highly suspicious for pulmonary edema. Less likely bilateral pneumonia. There is endotracheal tube in place with tip 4.3 cm above the carina. NG tube in place with tip in proximal stomach. No pneumothorax. Electronically Signed   By: Natasha MeadLiviu  Pop M.D.   On: 05/08/2016 11:21     STUDIES:  CXR 01/04 > central vascular congestion. Echo 01/04 > EEG 01/04 > burst suppression pattern with epileptiform bursts associated with clinical myoclonus. Consistent with profound cerebral dysfunction and associated with grim prognosis.  CULTURES: Resp Cx 1/4>  ANTIBIOTICS: None.  SIGNIFICANT EVENTS: 01/04 >  admitted.  LINES/TUBES: ETT 01/04 > A line 1/4 >   DISCUSSION: 66 y.o. male with hx DM, admitted 01/04 with cardiac arrest (VT/VF).  Required shock x 5. [facility] and cath revealed severe 3 vessel disease with EF 15%.  Decision made to defer cooling (after talking with Dr. Jacinto HalimGanji) due to risk of arrhthymias in setting severe 3 vessel disease and EF 15%.  ASSESSMENT / PLAN:  CARDIOVASCULAR A:  VF / VT witnessed arrest. Severe ICM - severe 3 vessel disease with EF 15% per cath lab. P:  Defer hypothermia due to risk of arrhthymias in setting severe 3 vessel disease and EF 15%. 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 Consistent with profound cerebral dysfunction and associated with grim prognosis. P:   Sedation:  Fentanyl PRN / Midazolam PRN. RASS goal: 0 to -1. Daily WUA.  PULMONARY A: Acute hypoxic respiratory failure - s/p intubation after cardiac arrest. Acute pulmonary edema. P:  Full vent support. Wean as able. Lasix 20mg  BID  RENAL A:   AKI. Anion gap metabolic acidosis >> resolved Hyponatremia >> resolved P:   BMP in AM.  GASTROINTESTINAL A:   GI prophylaxis. Nutrition. P:   SUP: Pantoprazole. NPO.  HEMATOLOGIC A:   VTE Prophylaxis. P:  subq heparin. CBC in AM.  INFECTIOUS A:   No indication of infection. P:   Monitor clinically.  ENDOCRINE A:   DM2: A1c 10.4 P:   SSI.  Family updated: Wife and daughter updated on findings thus far specifically on final read of EEG with grim prognosis. His wife reports that he does have a living will and that he would not want "heroic" efforts. Will continue to discuss with family goals of care.   Griffin Basil, MD  Internal Medicine PGY-3 Pager: [phone] After 3pm: [phone] 04/01/2016, 9:47 AM

## 2016-04-01 NOTE — Progress Notes (Signed)
This is a follow up visit with family per second spiritual care consult today. I spoke with Mr. Fabienne BrunsQualls wife and daughter at bedside and continued support. Family is requesting that other test be done to satisfy themselves before making decision to withdraw.  Chaplain available as needed.  Venida JarvisWatlington, [place], Chaplain,pager [phone]

## 2016-04-01 NOTE — Procedures (Signed)
History: 66 year old male status post cardiac arrest  Sedation: Propofol  Technique: This is a 21 channel routine scalp EEG performed at the bedside with bipolar and monopolar montages arranged in accordance to the international 10/20 system of electrode placement. One channel was dedicated to EKG recording.    Background: The background continues to consist of a burst suppression pattern, the bursts are slightly longer than previously at 1-3 seconds with an interburst interval of 2-3 seconds. There is no reactivity to external stimuli. The myoclonus is not visible on video today.  Photic stimulation: Physiologic driving is not performed  EEG Abnormalities: 1) burst suppression pattern  Clinical Interpretation: This EEG is consistent with a profound cerebral dysfunction as can be seen post anoxic arrest. In the absence of confounding factors such as medication, this EEG is consistent with a poor prognosis.  Ritta SlotMcNeill Kelcy Laible, MD [facility] [phone]  If 7pm- 7am, please page neurology on call as listed in AMION.

## 2016-04-01 NOTE — Progress Notes (Signed)
EEG completed, results pending. 

## 2016-04-01 NOTE — Progress Notes (Signed)
[place] donor called and Elink notified of DNR status.

## 2016-04-01 NOTE — Progress Notes (Signed)
Subjective:  No further myoclonus since being on Diprivan. Events overnight noted. Wife present at bedside.   Objective:  Vital Signs in the last 24 hours: Temp:  [97.5 F (36.4 C)-100.4 F (38 C)] 98.7 F (37.1 C) (01/05 0817) Pulse Rate:  [0-131] 124 (01/05 1100) Resp:  [15-78] 24 (01/05 1100) BP: (76-148)/(55-99) 93/68 (01/05 1000) SpO2:  [0 %-100 %] 92 % (01/05 1100) Arterial Line BP: (81-125)/(49-83) 115/61 (01/05 1100) FiO2 (%):  [60 %-100 %] 60 % (01/05 0831) Weight:  [80 kg (176 lb 5.9 oz)] 80 kg (176 lb 5.9 oz) (01/04 1600)  Intake/Output from previous day: 01/04 0701 - 01/05 0700 In: 385.6 [I.V.:335.6; NG/GT:50] Out: 1125 [Urine:1125]  Physical Exam:  General appearance: Sedated and on ventilator. No distress Eyes: negative findings: lids and lashes normal Neck: no carotid bruit and thyroid not enlarged, symmetric, no tenderness/mass/nodules Neck: JVP - normal, carotids 2+= without bruits Resp: clear to auscultation bilaterally Chest wall: No obvious bellowing or trauma from CPR Cardio: regular rate and rhythm, S1, S2 normal, no murmur, click, rub or gallop and tachycardia GI: soft, non-tender; bowel sounds normal; no masses,  no organomegaly Extremities: extremities normal, atraumatic, no cyanosis or edema  Lab Results: BMP  Recent Labs  04/06/2016 1122 04/19/2016 1134 03/29/2016 1224 04/01/16 0512  NA 138 133*  --  139  K 3.8 3.8  --  4.3  CL 102 101  --  107  CO2  --  18*  --  22  GLUCOSE 435* 431*  --  150*  BUN 24* 22*  --  28*  CREATININE 1.10 1.30* 1.31* 1.31*  CALCIUM  --  7.7*  --  8.8*  GFRNONAA  --  56* 55* 55*  GFRAA  --  >60 >60 >60    CBC  Recent Labs Lab 04/01/16 0512  WBC 20.8*  RBC 4.18*  HGB 12.7*  HCT 37.9*  PLT 293  MCV 90.7  MCH 30.4  MCHC 33.5  RDW 12.6    HEMOGLOBIN A1C Lab Results  Component Value Date   HGBA1C 10.4 (H) 04/15/2016   MPG 252 04/09/2016    Cardiac Panel (last 3 results)  Recent Labs   03/29/2016 1224 04/01/2016 1920 04/01/16 0512  TROPONINI 0.74* 1.09* 1.15*   TSH  Recent Labs  04/01/2016 1032 04/06/2016 1600  TSH 1.627 0.669    Recent Labs  03/30/2016 1134  PROT 5.4*  ALBUMIN 2.7*  AST 74*  ALT 46  ALKPHOS 75  BILITOT 0.7    Imaging: Imaging results have been reviewed  Cardiac Studies: EKG 04/01/2016: Sinus tachycardia at a rate of 126 bpm, frequent PACs, diffuse nonspecific ST-T abnormality, baseline artifact. Normal QT interval.  1. Left ventricle is mildly dilated. Severe global hypokinesis. LVEF 15-20%. Markedly elevated LVEDP at 30 mmHg. 2. Severe triple-vessel coronary artery disease. Dominant right occluded in the proximal segment, extensive collaterals to the right coronary artery from septal perforators of the LAD. 3. Chronic total occlusion of ostial circumflex, circumflex is large but diffusely diseased, ipsilateral collaterals present. Large obtuse marginal 2 is occluded and also deferred by faint collaterals. Appears to be severely diffusely diseased. 4. Mid LAD calcific 80-85% stenosis, large D1 with a mid 80-85% stenosis, severely diffusely diseased LAD and D1. They are bypassable. 5. Right innominate, right subclavian and RIMA widely patent, left subclavian and LIMA widely patent. 6. Normal abdominal aorta, 2 renal arteries one on either sides, widely patent. Aortoiliac bifurcation and iliac vessels and bilateral common femoral arteries are  normal. 7. A total of 50 mL of contrast utilized for diagnostic angiography.  Assessment/Plan:  1. Cardiac arrest probably due to chronic coronary artery disease leading to V. fib arrest. 2. NSTEMI, serum troponins are flat 3. Severe anoxic brain injury 4. Pulmonary arrest, presently on ventilator  Recommendation: I had a lengthy discussion with the patient's family including patient's wife. Patient's wife does not want him to have a meaningless life, a shunt has advanced directives at home, advised her that  I do not need this. Once all family members arrived, we can help the family members for terminal extubation from the ventilator and to keep the patient comfortable. I have canceled all the tests including MRI and blood tests. I'll continue to support the family in any which way possible.  I greatly appreciate pulmonary critical care involvement along with neurology involvement with Dr. Consuello Bossier opinion.   Yates Decamp, M.D. 04/01/2016, 11:26 AM [facility] Pager: [phone] Office: [phone] If no answer: [phone]

## 2016-04-01 NOTE — Progress Notes (Signed)
eLink Physician-Brief Progress Note Patient Name: Lucas Allen DOB: Aug 12, 1950 MRN: [id number]   Date of Service  04/01/2016  HPI/Events of Note  Case discussed with patient's wife and children and family.   Repeat EEG interpretation was similar to last night's EEG. Consistent with severe anoxic injury probably related to cardiac arrest, cerebral hypoperfusion worsened by patient's low EF . EEG results discussed with family. They understand patient's overall poor prognosis.   At present, he is a full DO NOT RESUSCITATE. No labs being drawn and wife is okay with that. On minimal medicines.   They are  waiting for some family members to see him tomorrow. Once everyone has seen him, plan is to terminally extubate patient.   Plan d/w RN  eICU Interventions          Daneen SchickJose Angelo A De Dios 04/01/2016, 10:14 PM

## 2016-04-01 NOTE — Progress Notes (Signed)
  Echocardiogram 2D Echocardiogram has been performed.  Lucas Allen 04/01/2016, 10:09 AM

## 2016-04-02 DIAGNOSIS — G931 Anoxic brain damage, not elsewhere classified: Secondary | ICD-10-CM

## 2016-04-02 MED ORDER — MORPHINE BOLUS VIA INFUSION
2.0000 mg | INTRAVENOUS | Status: DC | PRN
  Filled 2016-04-02: qty 5

## 2016-04-02 MED ORDER — SODIUM CHLORIDE 0.9 % IV SOLN
0.0000 mg/h | INTRAVENOUS | Status: DC
  Administered 2016-04-02: 10 mg/h via INTRAVENOUS
  Filled 2016-04-02 (×2): qty 10

## 2016-04-03 LAB — CULTURE, RESPIRATORY: SPECIAL REQUESTS: NORMAL

## 2016-04-03 LAB — CULTURE, RESPIRATORY W GRAM STAIN

## 2016-04-28 NOTE — Progress Notes (Signed)
Expiration Note:  Patient expired at 1725.  No breath sounds or Heart sounds noted per ausultation x 1 full minute.  Auscultated by Sunday CornHolly Church, RN and Gaspar Garbeanya Larenz Frasier, RN.  Family at bedside.

## 2016-04-28 NOTE — Progress Notes (Signed)
Nutrition Brief Note  Chart reviewed. Pt now transitioning to comfort care.  No nutrition interventions warranted at this time.  Please consult as needed.   Rush Salce RD, LDN, CNSC [phone] Pager [phone] After Hours Pager    

## 2016-04-28 NOTE — Progress Notes (Signed)
Pt transported to morgue with RN and NT. Security present.

## 2016-04-28 NOTE — Progress Notes (Addendum)
I met with the wife, answered all questions, she is very comfortable and understands the edition made regarding withdrawal of care. I'll be available for family support at any time. He will be terminally extubated this afternoon.

## 2016-04-28 NOTE — Progress Notes (Signed)
Pt terminally extubated at 1655 to RA. Pt's family came in after extubation

## 2016-04-28 NOTE — Discharge Summary (Signed)
PULMONARY / CRITICAL CARE MEDICINE  Name: Lucas Allen MRN: [id number] DOB: 1951-02-28 66 y.o.  Date of Admission: 04/20/2016 10:23 AM Date of Discharge: 04/05/2016 Attending Physician: Yates DecampJay Ganji, MD  Discharge Diagnosis: Principal Problem:   Cardiac arrest [facility](HCC) Active Problems:   Diabetes type 2, controlled (HCC)   ACS (acute coronary syndrome) (HCC)   Acute hypoxemic respiratory failure (HCC)   Anoxic brain injury (HCC)   Cause of death: Acute coronary syndrome Time of death: 1725  Disposition and follow-up:   [facility] Allen [facility].    Hospital Course: 66 year old man with DM2 (dx fall of 2016) presenting after he suddenly slumped over, turned bright red, then starting having possible seizure like activity. Witnessed by wife. AED placed upon arrival of EMS and received shocks 5 times before ROSC. Found to have anterolateral STEMI on EKG. He was intubated in the ED and repeat EKG negative for STEMI. Underwent cath with severe 3 vessel disease not amenable to intervention along with EF of 15%.  Hypothermia protocol deferred at this time due to high risk of arrhythmia with vasopressors used during hypothermia. He was noted to have myoclonus. EEG performed 1/4 demonstrated burst suppression pattern with epileptiform bursts associated with clinical myoclonus. Consistent with profound cerebral dysfunction and associated with grim prognosis. Prognosis and goals of care were discussed with family. The family came to the decision to transition to comfort care. He was terminally extubated and expired on 04/05/2016.  Signed: Lora PaulaJennifer T Lilliana Turner, MD 04/05/2016, 3:44 PM

## 2016-04-28 NOTE — Progress Notes (Signed)
PULMONARY / CRITICAL CARE MEDICINE   Name: Lucas Allen MRN: [id number] DOB: [birth year]    ADMISSION DATE:  April 19, 2016 CONSULTATION DATE:  04/19/2016  REFERRING MD:  Jacinto Halim  CHIEF COMPLAINT:  Cardiac Arrest  HISTORY OF PRESENT ILLNESS:  Pt is encephelopathic; therefore, this HPI is obtained from chart review. 66 year old man with DM2 (dx fall of 2016) presenting after he suddenly slumped over, turned bright red, then starting having possible seizure like activity. Witnessed by wife. AED placed upon arrival of EMS and received shocks 5 times before ROSC. Found to have anterolateral STEMI on EKG. He was intubated in the ED and repeat EKG negative for STEMI. Underwent cath with severe 3 vessel disease not amenable to intervention along with EF of 15%.  CVTS consulted for consideration CABG.  Hypothermia protocol deferred at this time due to high risk of arrhythmia with vasopressors used during hypothermia.  SUBJECTIVE:  Critically ill, Unresponsive on vent sedated on fent/versed  VITAL SIGNS: BP 105/77   Pulse (!) 127   Temp 99 F (37.2 C) (Core (Comment))   Resp 17   Ht 5\' 6"  (1.676 m)   Wt 176 lb 5.9 oz (80 kg)   SpO2 94%   BMI 28.47 kg/m   VENTILATOR SETTINGS: Vent Mode: PRVC FiO2 (%):  [60 %] 60 % Set Rate:  [18 bmp] 18 bmp Vt Set:  [510 mL] 510 mL PEEP:  [5 cmH20] 5 cmH20 Plateau Pressure:  [10 cmH20-18 cmH20] 10 cmH20  INTAKE / OUTPUT: I/O last 3 completed shifts: In: 568.6 [I.V.:458.6; NG/GT:110] Out: 1370 [Urine:1370]   PHYSICAL EXAMINATION: General: NAD Neuro: Unresponsive HEENT: [place]/AT. Pupils sluggish, sclerae anicteric.  ETT in place. Cardiovascular: Tachycardic  Lungs: Respirations even and unlabored.  CTA bilaterally, No W/R/R.  On vent. Abdomen: soft, NT/ND.  Musculoskeletal: No gross deformities, no edema.  Skin: Intact, warm, no rashes.  LABS:  BMET  Recent Labs Lab Apr 19, 2016 1122 04/19/16 1134 April 19, 2016 1224 04/01/16 0512  NA 138 133*  --  139   K 3.8 3.8  --  4.3  CL 102 101  --  107  CO2  --  18*  --  22  BUN 24* 22*  --  28*  CREATININE 1.10 1.30* 1.31* 1.31*  GLUCOSE 435* 431*  --  150*    Electrolytes  Recent Labs Lab 04-19-16 1134 April 19, 2016 1920 04/01/16 0512  CALCIUM 7.7*  --  8.8*  MG  --  1.9 2.0  PHOS  --  4.0 3.6    CBC  Recent Labs Lab 2016-04-19 1134 04-19-2016 1224 04/01/16 0512  WBC 24.7* 27.3* 20.8*  HGB 12.4* 13.0 12.7*  HCT 38.1* 39.0 37.9*  PLT 262 326 293    Coag's  Recent Labs Lab 04/19/16 1134  APTT 28  INR 1.16    Sepsis Markers  Recent Labs Lab 04/19/16 1600 2016-04-19 1605 04/01/16 0512 04/01/16 0756  LATICACIDVEN  --  5.4*  --  1.2  PROCALCITON 0.50  --  2.04  --     ABG  Recent Labs Lab April 19, 2016 1603 04/01/16 0336  PHART 7.304* 7.442  PCO2ART 35.8 35.0  PO2ART 203* 72.3*    Liver Enzymes  Recent Labs Lab 04/19/16 1134  AST 74*  ALT 46  ALKPHOS 75  BILITOT 0.7  ALBUMIN 2.7*    Cardiac Enzymes  Recent Labs Lab 04/19/2016 1224 Apr 19, 2016 1920 04/01/16 0512  TROPONINI 0.74* 1.09* 1.15*    Glucose  Recent Labs Lab April 19, 2016 2122 04/01/16 0038 04/01/16 0432 04/01/16 4098  04/01/16 0815 04/01/16 1135  GLUCAP 289* 203* 161* 112* 110* 113*    Imaging No results found.   STUDIES:  CXR 01/04 > central vascular congestion. Echo 01/04 > EEG 01/04 > burst suppression pattern with epileptiform bursts associated with clinical myoclonus. Consistent with profound cerebral dysfunction and associated with grim prognosis. EEG 1/5 burst suppression pattern -consistent with a profound cerebral dysfunction   CULTURES: Resp Cx 1/4>  ANTIBIOTICS: None.  SIGNIFICANT EVENTS: 01/04 > admitted.  LINES/TUBES: ETT 01/04 > A line 1/4 >   DISCUSSION: 66 y.o. male with hx DM, admitted 01/04 with cardiac arrest (VT/VF).  Required shock x 5. [facility] and cath revealed severe 3 vessel disease with EF 15%.  Decision made to  defer cooling (after talking with Dr. Jacinto HalimGanji) due to risk of arrhthymias in setting severe 3 vessel disease and EF 15%.  ASSESSMENT / PLAN:  CARDIOVASCULAR A:  VF / VT witnessed arrest. Severe ICM - severe 3 vessel disease with EF 15% per cath lab. P:  Cardiology following ASA, Lipitor daily Coreg BID  NEUROLOGIC A:   Acute encephalopathy due to anoxic brain injury: EEG with burst suppression pattern with epileptiform bursts associated with clinical myoclonus. Consistent with profound cerebral dysfunction and associated with grim prognosis. P:   Sedation:  Fentanyl PRN / Midazolam PRN. RASS goal: 0 to -1. Daily WUA.  PULMONARY A: Acute hypoxic respiratory failure - s/p intubation after cardiac arrest. Acute pulmonary edema. P:   Full vent support. Wean as able. Lasix 20mg  BID  RENAL A:   AKI. Anion gap metabolic acidosis >> resolved Hyponatremia >> resolved P:   BMP daily  GASTROINTESTINAL A:   GI prophylaxis. Nutrition. P:   SUP: Pantoprazole. NPO.  HEMATOLOGIC A:   VTE Prophylaxis. P:  subq heparin. CBC in AM.  INFECTIOUS A:   No indication of infection. P:   Monitor clinically.  ENDOCRINE A:   DM2: A1c 10.4 P:   SSI.  Family updated: Wife and daughter updated on findings of rpt EEG with grim prognosis. DNR issued, withdrawal of life support once family has had a chance to visit    Cc time x 1038m  Cyril Mourningakesh Alva MD. Tonny BollmanFCCP. [facility] Pager [phone] If no response call [phone]     04/20/2016, 8:23 AM

## 2016-04-28 DEATH — deceased

## 2018-05-27 IMAGING — CR DG CHEST 1V PORT
2 series · 2 of 2 positions shown · non-contrast
Comparison: None.

CLINICAL DATA: Chest pain, NG tube placement intubated

EXAM:
PORTABLE CHEST 1 VIEW

[ap (1 of 2)]
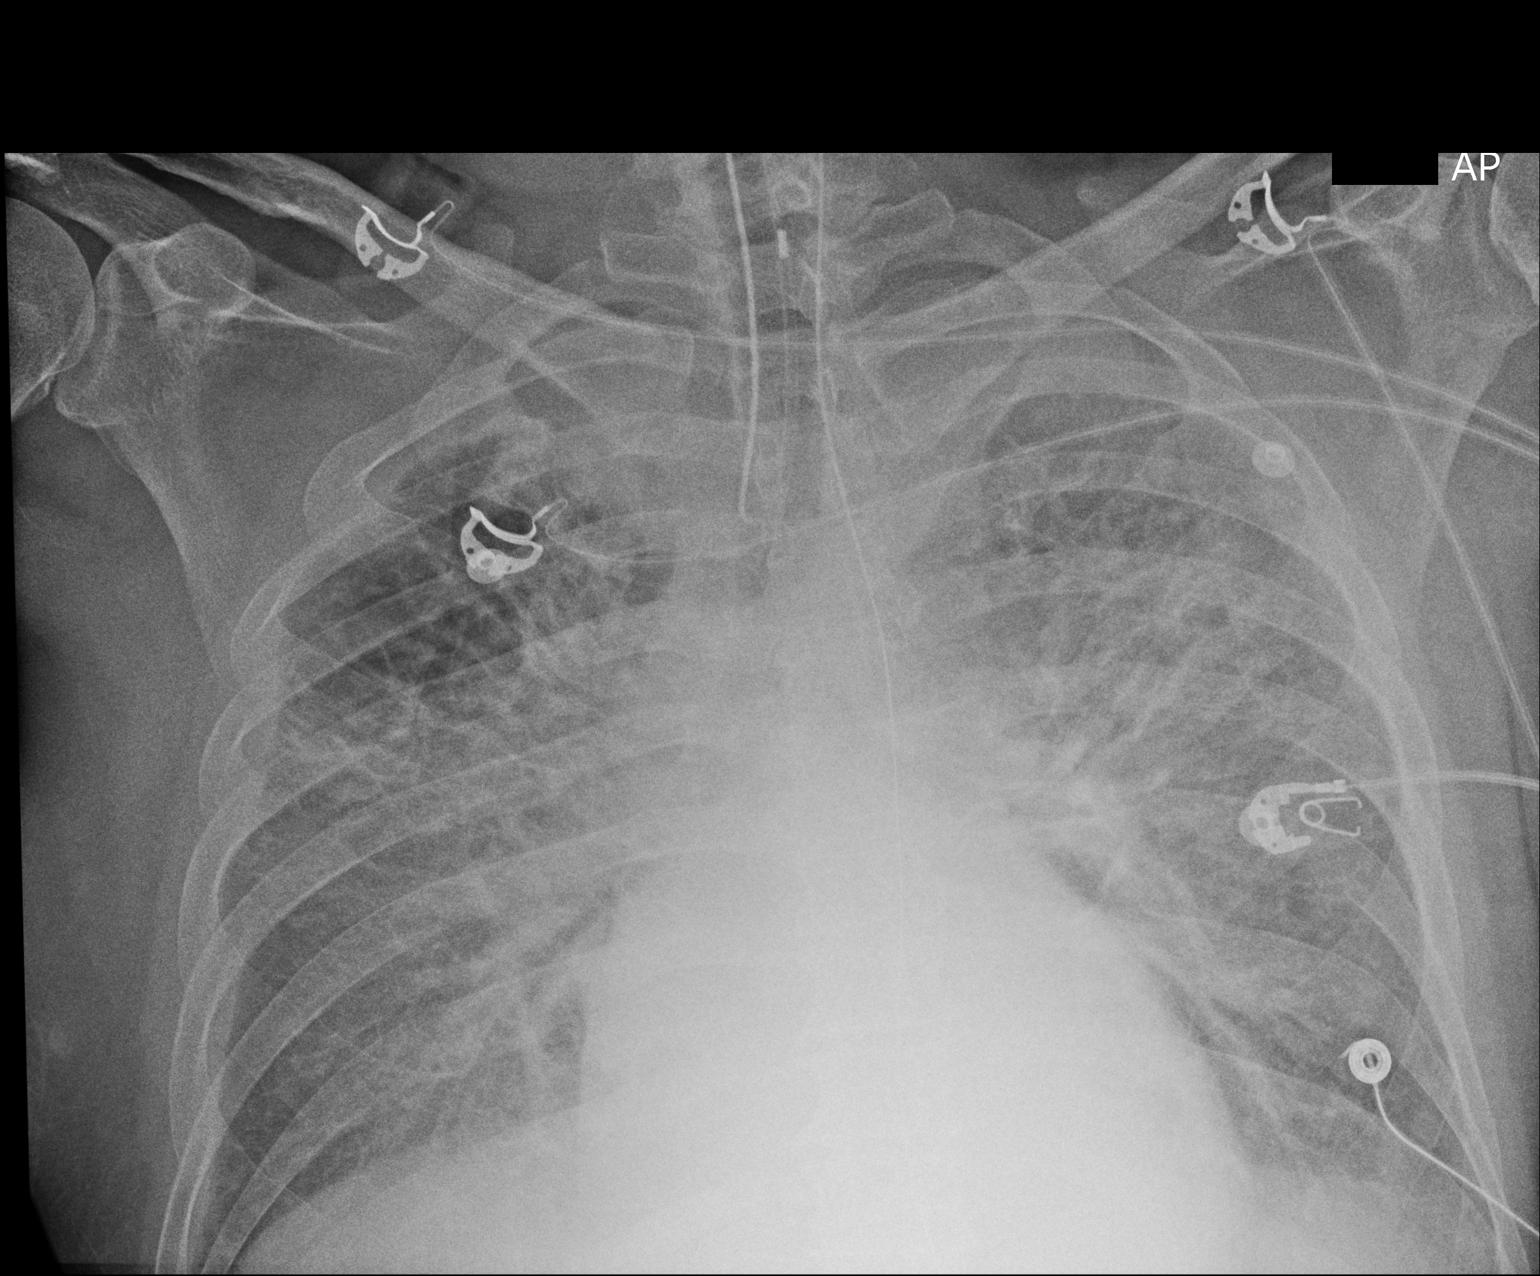

[ap (2 of 2)]
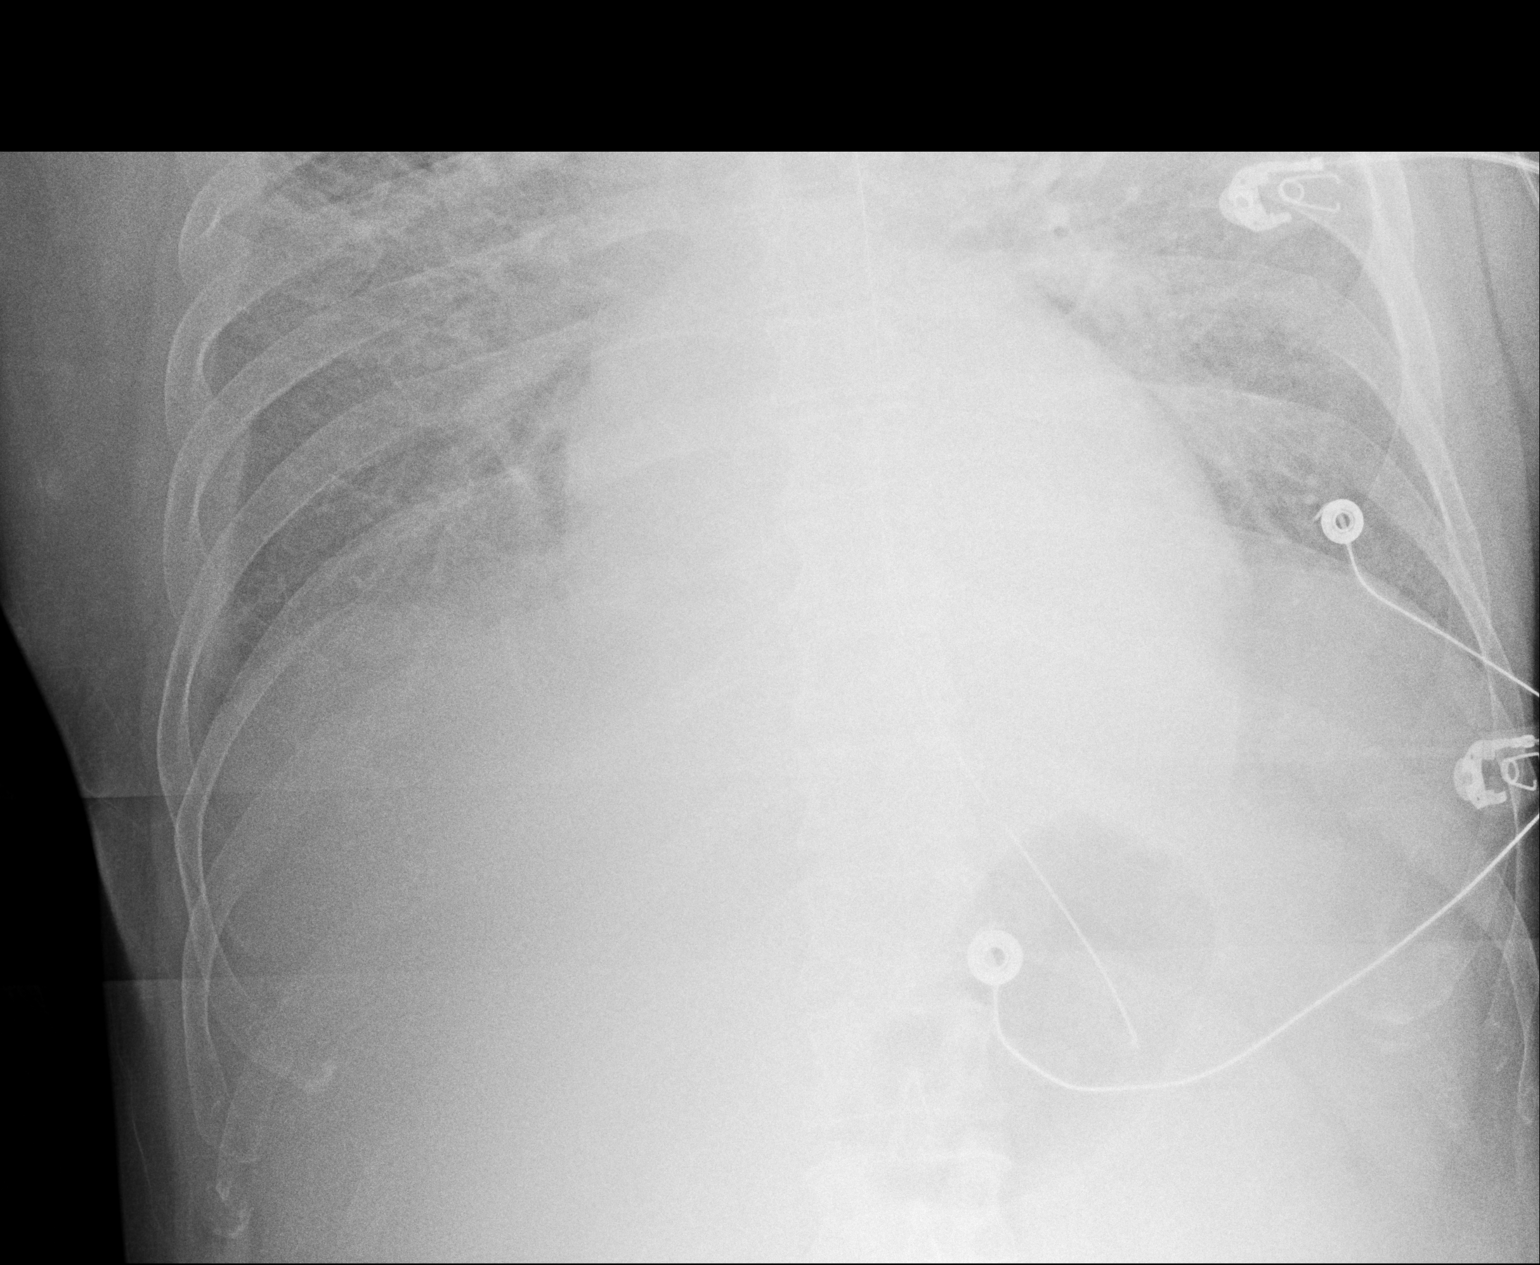

[2 of 2 positions shown; findings below may reference images not displayed]

FINDINGS: Cardiomediastinal silhouette is unremarkable. Central vascular
congestion and perihilar interstitial prominence with hazy airspace
disease highly suspicious for pulmonary edema. Less likely bilateral
pneumonia. There is endotracheal tube in place with tip 4.3 cm above
the carina. NG tube in place with tip in proximal stomach. No
pneumothorax.
IMPRESSION: Central vascular congestion and perihilar interstitial prominence
with hazy airspace disease highly suspicious for pulmonary edema.
Less likely bilateral pneumonia. There is endotracheal tube in place
with tip 4.3 cm above the carina. NG tube in place with tip in
proximal stomach. No pneumothorax.
